# Patient Record
Sex: Male | Born: 1980 | Race: Black or African American | Hispanic: No | Marital: Married | Smoking: Never smoker
Health system: Southern US, Community
[De-identification: ages and names within clinical notes are randomized; demographics above are authoritative.]

## PROBLEM LIST (undated history)

## (undated) DIAGNOSIS — I10 Essential (primary) hypertension: Secondary | ICD-10-CM

## (undated) DIAGNOSIS — I509 Heart failure, unspecified: Secondary | ICD-10-CM

---

## 2020-10-26 ENCOUNTER — Other Ambulatory Visit: Payer: Self-pay

## 2020-10-26 ENCOUNTER — Encounter (HOSPITAL_COMMUNITY): Payer: Self-pay | Admitting: Emergency Medicine

## 2020-10-26 ENCOUNTER — Ambulatory Visit (HOSPITAL_COMMUNITY)
Admission: EM | Admit: 2020-10-26 | Discharge: 2020-10-26 | Disposition: A | Discharge status: Home / Self Care | Payer: Medicaid Other | Attending: Family Medicine | Admitting: Family Medicine

## 2020-10-26 DIAGNOSIS — Z20822 Contact with and (suspected) exposure to covid-19: Secondary | ICD-10-CM | POA: Diagnosis not present

## 2020-10-26 DIAGNOSIS — J069 Acute upper respiratory infection, unspecified: Secondary | ICD-10-CM | POA: Diagnosis present

## 2020-10-26 MED ORDER — PROMETHAZINE-DM 6.25-15 MG/5ML PO SYRP: 5.0000 mL | ORAL_SOLUTION | Freq: Four times a day (QID) | ORAL | 0 refills | Status: DC | PRN

## 2020-10-26 NOTE — ED Provider Notes (Signed)
[facility]    CSN: [id number] Arrival date & time: 10/26/20  1729      History   Chief Complaint Chief Complaint  Patient presents with  . URI    HPI Ruben Espinoza is a 40 y.o. male.   Here today with 1 day history of cough, fever, headaches, nausea, fatigue, body aches. Denies CP, SOB, vomiting, diarrhea. Taking ibuprofen without much relief. Denies known chronic medical problems. Work exposures to [employer].      History reviewed. No pertinent past medical history.  There are no problems to display for this patient.   History reviewed. No pertinent surgical history.     Home Medications    Prior to Admission medications   Medication Sig Start Date End Date Taking? Authorizing Provider  promethazine-dextromethorphan (PROMETHAZINE-DM) 6.25-15 MG/5ML syrup Take 5 mLs by mouth 4 (four) times daily as needed for cough. 10/26/20  Yes [name], PA-C    Family History History reviewed. No pertinent family history.  Social History Social History   Tobacco Use  . Smoking status: Never Smoker  . Smokeless tobacco: Never Used     Allergies   Shellfish allergy   Review of Systems Review of Systems PER HPI   Physical Exam Triage Vital Signs ED Triage Vitals  Enc Vitals Group     BP 10/26/20 1846 (!) 187/108     Pulse Rate 10/26/20 1846 83     Resp 10/26/20 1846 16     Temp 10/26/20 1846 99.1 F (37.3 C)     Temp Source 10/26/20 1846 Oral     SpO2 10/26/20 1846 94 %     Weight --      Height --      Head Circumference --      Peak Flow --      Pain Score 10/26/20 1848 3     Pain Loc --      Pain Edu? --      Excl. in GC? --    No data found.  Updated Vital Signs BP (!) 187/108 (BP Location: Left Arm)   Pulse 83   Temp 99.1 F (37.3 C) (Oral)   Resp 16   SpO2 94%   Visual Acuity Right Eye Distance:   Left Eye Distance:   Bilateral Distance:    Right Eye Near:   Left Eye Near:    Bilateral Near:     Physical  Exam Vitals and nursing note reviewed.  Constitutional:      Appearance: Normal appearance.  HENT:     Head: Atraumatic.     Right Ear: Tympanic membrane normal.     Left Ear: Tympanic membrane normal.     Nose: Rhinorrhea present.     Mouth/Throat:     Mouth: Mucous membranes are moist.     Pharynx: Posterior oropharyngeal erythema present.  Eyes:     Extraocular Movements: Extraocular movements intact.     Conjunctiva/sclera: Conjunctivae normal.  Cardiovascular:     Rate and Rhythm: Normal rate and regular rhythm.     Heart sounds: Normal heart sounds.  Pulmonary:     Effort: Pulmonary effort is normal. No respiratory distress.     Breath sounds: Normal breath sounds. No wheezing.  Abdominal:     General: Bowel sounds are normal. There is no distension.     Palpations: Abdomen is soft.     Tenderness: There is no abdominal tenderness. There is no guarding.  Musculoskeletal:  General: Normal range of motion.     Cervical back: Normal range of motion and neck supple.  Skin:    General: Skin is warm and dry.     Findings: No rash.  Neurological:     General: No focal deficit present.     Mental Status: He is oriented to person, place, and time.  Psychiatric:        Mood and Affect: Mood normal.        Thought Content: Thought content normal.        Judgment: Judgment normal.      UC Treatments / Results  Labs (all labs ordered are listed, but only abnormal results are displayed) Labs Reviewed  SARS CORONAVIRUS 2 (TAT 6-24 HRS)    EKG   Radiology No results found.  Procedures Procedures (including critical care time)  Medications Ordered in UC Medications - No data to display  Initial Impression / Assessment and Plan / UC Course  I have reviewed the triage vital signs and the nursing notes.  Pertinent labs & imaging results that were available during my care of the patient were reviewed by me and considered in my medical decision making (see chart  for details).     Exam and vitals reassuring today, COVID pcr pending. Phenergan DM, OTC medications and supportive care reviewed. Work note given, instructed to isolate, return for worsening sxs.   Final Clinical Impressions(s) / UC Diagnoses   Final diagnoses:  Viral URI with cough   Discharge Instructions   None    ED Prescriptions    Medication Sig Dispense Auth. Provider   promethazine-dextromethorphan (PROMETHAZINE-DM) 6.25-15 MG/5ML syrup Take 5 mLs by mouth 4 (four) times daily as needed for cough. 100 mL Particia Espinoza, [place]     PDMP not reviewed this encounter.   Particia Espinoza, [place] 10/26/20 1946

## 2020-10-26 NOTE — ED Triage Notes (Signed)
Pt c/o cold sx onset yest associated w/cough, fever, headaches, nausea  Denies vomiting, diarrhea  Taking OTC Ibuprofen   Reports his boss tested positive for COVID  A&O X4... NAD.Marland Kitchen. ambulatory

## 2020-10-27 LAB — SARS CORONAVIRUS 2 (TAT 6-24 HRS): SARS Coronavirus 2: NEGATIVE

## 2020-12-12 ENCOUNTER — Encounter (HOSPITAL_COMMUNITY): Payer: Self-pay | Admitting: Emergency Medicine

## 2020-12-12 ENCOUNTER — Emergency Department (HOSPITAL_COMMUNITY): Payer: Medicaid Other

## 2020-12-12 ENCOUNTER — Other Ambulatory Visit: Payer: Self-pay

## 2020-12-12 ENCOUNTER — Emergency Department (HOSPITAL_COMMUNITY)
Admission: EM | Admit: 2020-12-12 | Discharge: 2020-12-12 | Disposition: A | Discharge status: Home / Self Care | Payer: Medicaid Other | Attending: Emergency Medicine | Admitting: Emergency Medicine

## 2020-12-12 DIAGNOSIS — R55 Syncope and collapse: Secondary | ICD-10-CM | POA: Diagnosis not present

## 2020-12-12 DIAGNOSIS — R42 Dizziness and giddiness: Secondary | ICD-10-CM | POA: Diagnosis not present

## 2020-12-12 DIAGNOSIS — R030 Elevated blood-pressure reading, without diagnosis of hypertension: Secondary | ICD-10-CM | POA: Insufficient documentation

## 2020-12-12 DIAGNOSIS — I509 Heart failure, unspecified: Secondary | ICD-10-CM | POA: Diagnosis not present

## 2020-12-12 DIAGNOSIS — I10 Essential (primary) hypertension: Secondary | ICD-10-CM

## 2020-12-12 HISTORY — DX: Heart failure, unspecified: I50.9

## 2020-12-12 LAB — BASIC METABOLIC PANEL
Anion gap: 9 (ref 5–15)
BUN: 19 mg/dL (ref 6–20)
CO2: 27 mmol/L (ref 22–32)
Calcium: 9.1 mg/dL (ref 8.9–10.3)
Chloride: 102 mmol/L (ref 98–111)
Creatinine, Ser: 1.82 mg/dL — ABNORMAL HIGH (ref 0.61–1.24)
GFR, Estimated: 48 mL/min — ABNORMAL LOW (ref >60)
Glucose, Bld: 110 mg/dL — ABNORMAL HIGH (ref 70–99)
Potassium: 3.8 mmol/L (ref 3.5–5.1)
Sodium: 138 mmol/L (ref 135–145)

## 2020-12-12 LAB — TSH: TSH: 0.552 u[IU]/mL (ref 0.350–4.500)

## 2020-12-12 LAB — CBC
HCT: 51.3 % (ref 39.0–52.0)
Hemoglobin: 16.7 g/dL (ref 13.0–17.0)
MCH: 29.9 pg (ref 26.0–34.0)
MCHC: 32.6 g/dL (ref 30.0–36.0)
MCV: 91.9 fL (ref 80.0–100.0)
Platelets: 208 10*3/uL (ref 150–400)
RBC: 5.58 MIL/uL (ref 4.22–5.81)
RDW: 12.3 % (ref 11.5–15.5)
WBC: 10.9 10*3/uL — ABNORMAL HIGH (ref 4.0–10.5)
nRBC: 0 % (ref 0.0–0.2)

## 2020-12-12 LAB — URINALYSIS, ROUTINE W REFLEX MICROSCOPIC
Bilirubin Urine: NEGATIVE
Glucose, UA: 250 mg/dL — AB
Hgb urine dipstick: NEGATIVE
Ketones, ur: NEGATIVE mg/dL
Leukocytes,Ua: NEGATIVE
Nitrite: NEGATIVE
Protein, ur: NEGATIVE mg/dL
Specific Gravity, Urine: 1.025 (ref 1.005–1.030)
pH: 6 (ref 5.0–8.0)

## 2020-12-12 LAB — TROPONIN I (HIGH SENSITIVITY)
Troponin I (High Sensitivity): 22 ng/L — ABNORMAL HIGH (ref <18)
Troponin I (High Sensitivity): 19 ng/L — ABNORMAL HIGH (ref <18)

## 2020-12-12 LAB — CBG MONITORING, ED: Glucose-Capillary: 123 mg/dL — ABNORMAL HIGH (ref 70–99)

## 2020-12-12 MED ORDER — HYDRALAZINE HCL 25 MG PO TABS
100.0000 mg | ORAL_TABLET | Freq: Once | ORAL | Status: AC
  Administered 2020-12-12: 100 mg via ORAL
  Filled 2020-12-12: qty 4

## 2020-12-12 MED ORDER — CARVEDILOL 12.5 MG PO TABS
25.0000 mg | ORAL_TABLET | Freq: Once | ORAL | Status: AC
  Administered 2020-12-12: 25 mg via ORAL
  Filled 2020-12-12: qty 2

## 2020-12-12 MED ORDER — LORAZEPAM 2 MG/ML IJ SOLN: 1.0000 mg | Freq: Once | INTRAMUSCULAR | Status: DC

## 2020-12-12 MED ORDER — LABETALOL HCL 5 MG/ML IV SOLN
10.0000 mg | Freq: Once | INTRAVENOUS | Status: AC
  Administered 2020-12-12: 10 mg via INTRAVENOUS
  Filled 2020-12-12: qty 4

## 2020-12-12 NOTE — ED Notes (Signed)
Transported to CT 

## 2020-12-12 NOTE — Discharge Instructions (Signed)
1.  You must follow-up with [facility] for further evaluation.  Due to your persistent and poorly controlled hypertension, you have increased risk for a stroke.  There is also concern for possible seizure.  You will need further evaluation.  An ambulatory referral has been placed.  Call tomorrow to schedule your appointment. 2.  No driving or doing activities that could result in injury if you had a seizure or loss of consciousness until you have been seen by the neurologist and completed evaluation 3.  Take all of your blood pressure medications as prescribed.  Follow instructions for diet management and lifestyle management for hypertension.  Keep a journal of your blood pressures.  It is very important you work with your doctor to get control of your blood pressure. 4.  Return to the emergency department if you develop any concerning or worsening symptoms

## 2020-12-12 NOTE — ED Triage Notes (Signed)
Pt reports syncopal episode at work last night.  Denies injury from fall.  Denies dizziness.

## 2020-12-12 NOTE — ED Provider Notes (Signed)
Care of the patient assumed at the change of shift. Patient here for syncope, initially hypertensive but improving with his meds. He was awaiting MRI at shift change.  Physical Exam  BP (!) 156/94 (BP Location: Right Arm)   Pulse 72   Temp 98.3 F (36.8 C) (Oral)   Resp 16   SpO2 100%   Physical Exam Awake and alert No obvious focal deficits ED Course/Procedures     Procedures  MDM  MRI with chronic changes, advanced for age but no acute process. Suspect this is secondary to HTN. He reports he has refills for all of his medications and does not need any additional rx today. PCP follow up and Neurology referral.       Pollyann Savoy, MD 12/12/20 1723

## 2020-12-12 NOTE — ED Provider Notes (Signed)
[facility] EMERGENCY DEPARTMENT Provider Note   CSN: [id number] Arrival date & time: 12/12/20  5726     History Chief Complaint  Patient presents with  . Loss of Consciousness    Ruben Espinoza is a 40 y.o. male.  HPI Patient reports he had episode of passing out at work last night.  He works evening shift.  He went into work at about 6 PM.  He reports around 10pm he went up some stairs and then passed out quite abruptly at the top of the steps.  He denies he was feeling any chest pain or shortness of breath when this happened.  He reports he typically goes up and down the stairs without issue.  He denies really any symptoms leading up to it but does note that it seemed like maybe his hearing temporarily stopped just before it occurred.  He denies tunnel vision, double vision or loss of vision.  He reports when he came back around, he did feel a little disoriented.  He denies he had any focal weakness numbness or tingling.  He did not have a headache or double vision.  He reports he went to the nurses station and they checked his blood pressure.  Blood pressure was elevated but he reports that his blood pressure is chronically elevated despite being compliant with multiple antihypertensive medications.  He denies any drug use.  He reports he went home and stayed up most of the rest of the night.  He reports he stayed up because he just does not typically like to sleep at night.  He waited for his brother to get home to drive him to the emergency department for evaluation.  He reports he continued to have a symptom of an "odd pulsating sensation".  He denies he ever experienced any focal weakness numbness or tingling of any extremity.  He denies that he feels badly.  He reports he does feel slightly more dizzy at this time if he changes position or sits up or stands    Past Medical History:  Diagnosis Date  . CHF (congestive heart failure) (HCC)     There are no problems to  display for this patient.   History reviewed. No pertinent surgical history.     No family history on file.  Social History   Tobacco Use  . Smoking status: Never Smoker  . Smokeless tobacco: Never Used  Substance Use Topics  . Alcohol use: Yes  . Drug use: Not Currently    Home Medications Prior to Admission medications   Medication Sig Start Date End Date Taking? Authorizing Provider  amLODipine (NORVASC) 10 MG tablet Take 10 mg by mouth daily. 12/05/20  Yes [provider]  carvedilol (COREG) 25 MG tablet Take 25 mg by mouth 2 (two) times daily. 11/14/20  Yes [provider]  furosemide (LASIX) 40 MG tablet Take 80 mg by mouth daily. 11/17/20  Yes [provider]  hydrALAZINE (APRESOLINE) 100 MG tablet Take 100 mg by mouth 3 (three) times daily. 11/19/20  Yes [provider]  losartan (COZAAR) 100 MG tablet Take 100 mg by mouth daily. 11/14/20  Yes [provider]  spironolactone (ALDACTONE) 25 MG tablet Take 25 mg by mouth daily. 11/17/20  Yes [provider]  promethazine-dextromethorphan (PROMETHAZINE-DM) 6.25-15 MG/5ML syrup Take 5 mLs by mouth 4 (four) times daily as needed for cough. Patient not taking: Reported on 12/12/2020 10/26/20   [name], PA-C    Allergies  Shellfish allergy and Isosorbide dinitrate  Review of Systems   Review of Systems 10 systems reviewed and negative except as per HPI Physical Exam Updated Vital Signs BP (!) 162/115 (BP Location: Left Arm)   Pulse 82   Temp 98.3 F (36.8 C) (Oral)   Resp 20   SpO2 97%   Physical Exam Constitutional:      Appearance: He is well-developed and well-nourished.  HENT:     Head: Normocephalic and atraumatic.     Mouth/Throat:     Mouth: Mucous membranes are moist.     Pharynx: Oropharynx is clear.  Eyes:     Extraocular Movements: Extraocular movements intact and EOM normal.     Conjunctiva/sclera: Conjunctivae normal.     Pupils:  Pupils are equal, round, and reactive to light.  Cardiovascular:     Rate and Rhythm: Normal rate and regular rhythm.     Pulses: Intact distal pulses.     Heart sounds: Normal heart sounds.  Pulmonary:     Effort: Pulmonary effort is normal.     Breath sounds: Normal breath sounds.  Abdominal:     General: Bowel sounds are normal. There is no distension.     Palpations: Abdomen is soft.     Tenderness: There is no abdominal tenderness.  Musculoskeletal:        General: No tenderness or edema. Normal range of motion.     Cervical back: Neck supple.     Right lower leg: No edema.     Left lower leg: No edema.  Skin:    General: Skin is warm, dry and intact.  Neurological:     Mental Status: He is alert and oriented to person, place, and time.     GCS: GCS eye subscore is 4. GCS verbal subscore is 5. GCS motor subscore is 6.     Cranial Nerves: No cranial nerve deficit.     Sensory: No sensory deficit.     Motor: No weakness.     Coordination: Coordination normal.     Deep Tendon Reflexes: Strength normal.     Comments: Normal finger-nose exam bilaterally.  Motor strength 5\5 upper lower extremities.  No sensory deficit.  Psychiatric:        Mood and Affect: Mood and affect and mood normal.     ED Results / Procedures / Treatments   Labs (all labs ordered are listed, but only abnormal results are displayed) Labs Reviewed  BASIC METABOLIC PANEL - Abnormal; Notable for the following components:      Result Value   Glucose, Bld 110 (*)    Creatinine, Ser 1.82 (*)    GFR, Estimated 48 (*)    All other components within normal limits  CBC - Abnormal; Notable for the following components:   WBC 10.9 (*)    All other components within normal limits  URINALYSIS, ROUTINE W REFLEX MICROSCOPIC - Abnormal; Notable for the following components:   Glucose, UA 250 (*)    All other components within normal limits  CBG MONITORING, ED - Abnormal; Notable for the following components:    Glucose-Capillary 123 (*)    All other components within normal limits  TROPONIN I (HIGH SENSITIVITY) - Abnormal; Notable for the following components:   Troponin I (High Sensitivity) 22 (*)    All other components within normal limits  TSH  RAPID URINE DRUG SCREEN, HOSP PERFORMED  TROPONIN I (HIGH SENSITIVITY)    EKG EKG Interpretation  Date/Time:  Tuesday December 12 2020 09:40:33 EST Ventricular Rate:  78 PR Interval:  170 QRS Duration: 104 QT Interval:  418 QTC Calculation: 476 R Axis:   -23 Text Interpretation: Normal sinus rhythm Moderate voltage criteria for LVH, may be normal variant ( R in aVL , Cornell product ) T wave abnormality, consider lateral ischemia Prolonged QT Abnormal ECG no STEMI. no old comparison Confirmed by Arby Barrette [phone]) on 12/12/2020 12:39:08 PM   Radiology CT Head Wo Contrast  Result Date: 12/12/2020 CLINICAL DATA:  Mental status change, unknown cause. Additional history provided: Patient reportedly "passed out" last night. EXAM: CT HEAD WITHOUT CONTRAST TECHNIQUE: Contiguous axial images were obtained from the base of the skull through the vertex without intravenous contrast. COMPARISON:  No pertinent prior exams available for comparison. FINDINGS: Brain: Nonspecific cerebral Soltis matter disease is overall mild, but advanced for age. The right parietal lobe is most notably affected. There is no acute intracranial hemorrhage. No demarcated cortical infarct. No extra-axial fluid collection. No evidence of intracranial mass. No midline shift. Vascular: No hyperdense vessel. Skull: Normal. Negative for fracture or focal lesion. Sinuses/Orbits: Visualized orbits show no acute finding. Mild bilateral ethmoid, sphenoid and maxillary sinus mucosal thickening at the imaged levels. Other: Right parietal scalp soft tissue swelling/hematoma. IMPRESSION: No evidence of acute intracranial abnormality. Parietal scalp soft tissue swelling/hematoma. 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 The right parietal lobe is most notably affected. Mild paranasal sinus mucosal thickening. Electronically Signed   By: Jackey Loge DO   On: 12/12/2020 13:48    Procedures Procedures   Medications Ordered in ED Medications  LORazepam (ATIVAN) injection 1 mg (has no administration in time range)  labetalol (NORMODYNE) injection 10 mg (has no administration in time range)  carvedilol (COREG) tablet 25 mg (25 mg Oral Given 12/12/20 1405)  hydrALAZINE (APRESOLINE) tablet 100 mg (100 mg Oral Given 12/12/20 1403)    ED Course  I have reviewed the triage vital signs and the nursing notes.  Pertinent labs & imaging results that were available during my care of the patient were reviewed by me and considered in my medical decision making (see chart for details).    MDM Rules/Calculators/A&P                         Consult: Reviewed with Dr. Jerrell Belfast  Patient does have longstanding history of hypertension despite young age.  He reports poor control of hypertension despite compliance with medications.  Patient had a syncopal episode without injury.  He denied prodromal symptoms.  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  History and findings reviewed with neurology.  At this time we will proceed with MRI to rule out any acute stroke given patient's risk factors.  Neurologic exam is normal at this time.  Patient did not endorse any chest pain or palpitation.  Troponins are flat and EKG does not show acute ischemic changes.  If work-up does not reveal acute cause for syncope.  Per consultation with neurology, plan will be for expeditious outpatient follow-up with neurology and further evaluation for possible seizure with seizure precautions given.  Patient will also need close follow-up with his PCP to optimize blood pressure management.  On review of systems, patient does not suggest ongoing symptoms of endorgan damage with  hypertension.  He is not experiencing exertional chest pain, routine headaches or visual problems.  Dr. Bernette Mayers will follow up on MRI for final disposition with anticipated  discharge. Final Clinical Impression(s) / ED Diagnoses Final diagnoses:  Syncope and collapse    Rx / DC Orders ED Discharge Orders    None       Arby BarrettePfeiffer, Elivia Robotham, MD 12/12/20 [phone]

## 2021-04-18 ENCOUNTER — Other Ambulatory Visit: Payer: Self-pay

## 2021-04-18 ENCOUNTER — Encounter (HOSPITAL_COMMUNITY): Payer: Self-pay

## 2021-04-18 ENCOUNTER — Ambulatory Visit (HOSPITAL_COMMUNITY)
Admission: EM | Admit: 2021-04-18 | Discharge: 2021-04-18 | Disposition: A | Discharge status: Home / Self Care | Payer: Medicaid Other | Attending: Family Medicine | Admitting: Family Medicine

## 2021-04-18 DIAGNOSIS — Z0189 Encounter for other specified special examinations: Secondary | ICD-10-CM

## 2021-04-18 DIAGNOSIS — Z20822 Contact with and (suspected) exposure to covid-19: Secondary | ICD-10-CM | POA: Insufficient documentation

## 2021-04-18 LAB — SARS CORONAVIRUS 2 (TAT 6-24 HRS): SARS Coronavirus 2: NEGATIVE

## 2021-04-18 NOTE — ED Triage Notes (Signed)
Pt presents for COVID testing after exposure. Pt denies fever, shortness of breath, cough, or any other symptoms.

## 2021-04-26 ENCOUNTER — Other Ambulatory Visit: Payer: Self-pay

## 2021-04-26 ENCOUNTER — Ambulatory Visit (HOSPITAL_COMMUNITY)
Admission: EM | Admit: 2021-04-26 | Discharge: 2021-04-26 | Disposition: A | Discharge status: Home / Self Care | Payer: Medicaid Other | Attending: Emergency Medicine | Admitting: Emergency Medicine

## 2021-04-26 ENCOUNTER — Encounter (HOSPITAL_COMMUNITY): Payer: Self-pay

## 2021-04-26 DIAGNOSIS — R519 Headache, unspecified: Secondary | ICD-10-CM | POA: Insufficient documentation

## 2021-04-26 DIAGNOSIS — J029 Acute pharyngitis, unspecified: Secondary | ICD-10-CM | POA: Diagnosis not present

## 2021-04-26 DIAGNOSIS — Z79899 Other long term (current) drug therapy: Secondary | ICD-10-CM | POA: Insufficient documentation

## 2021-04-26 DIAGNOSIS — B349 Viral infection, unspecified: Secondary | ICD-10-CM | POA: Diagnosis not present

## 2021-04-26 DIAGNOSIS — Z20822 Contact with and (suspected) exposure to covid-19: Secondary | ICD-10-CM | POA: Diagnosis not present

## 2021-04-26 LAB — SARS CORONAVIRUS 2 (TAT 6-24 HRS): SARS Coronavirus 2: NEGATIVE

## 2021-04-26 NOTE — ED Triage Notes (Signed)
Pt states he feels tired, has headache and feels nauseas.   States he was exposed to his brother in law who has COVID.

## 2021-04-26 NOTE — ED Provider Notes (Signed)
[facility]    CSN: [id number] Arrival date & time: 04/26/21  2836      History   Chief Complaint Chief Complaint  Patient presents with   Fatigue   Headache    HPI Ruben Espinoza is a 40 y.o. male.   Patient presents with intermittent generalized headache, fatigue, intermittent nausea, body aches, chills, nasal congestion and mild sore throat for 5 days.  Denies shortness of breath, cough, fever, ear pain or fullness. Can tolerate food and liquids but has been fasting for the last three days. Was feeling unwell at the beginning of last week, got COVID test on 6/30 at [facility], negative. Started to feel unwell again on 7/2, got exposed to covid on 7/4, feels like symptoms worsened after exposure. Not taking medications, feels it causes GI upset.   Past Medical History:  Diagnosis Date   CHF (congestive heart failure) (HCC)     There are no problems to display for this patient.   History reviewed. No pertinent surgical history.     Home Medications    Prior to Admission medications   Medication Sig Start Date End Date Taking? Authorizing Provider  amLODipine (NORVASC) 10 MG tablet Take 10 mg by mouth daily. 12/05/20   [provider]  carvedilol (COREG) 25 MG tablet Take 25 mg by mouth 2 (two) times daily. 11/14/20   [provider]  furosemide (LASIX) 40 MG tablet Take 80 mg by mouth daily. 11/17/20   [provider]  hydrALAZINE (APRESOLINE) 100 MG tablet Take 100 mg by mouth 3 (three) times daily. 11/19/20   [provider]  losartan (COZAAR) 100 MG tablet Take 100 mg by mouth daily. 11/14/20   [provider]  promethazine-dextromethorphan (PROMETHAZINE-DM) 6.25-15 MG/5ML syrup Take 5 mLs by mouth 4 (four) times daily as needed for cough. Patient not taking: No sig reported 10/26/20   [name], PA-C  spironolactone (ALDACTONE) 25 MG tablet Take 25 mg by mouth daily. 11/17/20   [provider]    Family  History Family History  Problem Relation Age of Onset   Healthy Mother     Social History Social History   Tobacco Use   Smoking status: Never   Smokeless tobacco: Never  Vaping Use   Vaping Use: Never used  Substance Use Topics   Alcohol use: Yes   Drug use: Not Currently     Allergies   Shellfish allergy and Isosorbide dinitrate   Review of Systems Review of Systems Defer to HPI      Physical Exam Triage Vital Signs ED Triage Vitals  Enc Vitals Group     BP 04/26/21 1157 (!) 173/106     Pulse Rate 04/26/21 1157 61     Resp 04/26/21 1157 17     Temp 04/26/21 1157 99.2 F (37.3 C)     Temp Source 04/26/21 1157 Oral     SpO2 04/26/21 1157 100 %     Weight --      Height --      Head Circumference --      Peak Flow --      Pain Score 04/26/21 1156 0     Pain Loc --      Pain Edu? --      Excl. in GC? --    No data found.  Updated Vital Signs BP (!) 173/106 (BP Location: Left Arm)   Pulse 61   Temp 99.2 F (37.3 C) (Oral)   Resp  17   SpO2 100%   Visual Acuity Right Eye Distance:   Left Eye Distance:   Bilateral Distance:    Right Eye Near:   Left Eye Near:    Bilateral Near:     Physical Exam Constitutional:      Appearance: He is well-developed. He is obese.  HENT:     Head: Normocephalic.     Right Ear: Hearing, ear canal and external ear normal. A middle ear effusion is present.     Left Ear: Hearing, ear canal and external ear normal. A middle ear effusion is present.     Nose: Congestion present. No rhinorrhea.     Mouth/Throat:     Mouth: Mucous membranes are moist.     Pharynx: Oropharynx is clear.  Eyes:     Extraocular Movements: Extraocular movements intact.     Pupils: Pupils are equal, round, and reactive to light.  Cardiovascular:     Rate and Rhythm: Normal rate and regular rhythm.     Pulses: Normal pulses.     Heart sounds: Normal heart sounds.  Pulmonary:     Effort: Pulmonary effort is normal.     Breath sounds:  Normal breath sounds.  Musculoskeletal:     Cervical back: Normal range of motion and neck supple.  Neurological:     Mental Status: He is alert and oriented to person, place, and time.  Psychiatric:        Mood and Affect: Mood normal.        Behavior: Behavior normal.     UC Treatments / Results  Labs (all labs ordered are listed, but only abnormal results are displayed) Labs Reviewed  SARS CORONAVIRUS 2 (TAT 6-24 HRS)    EKG   Radiology No results found.  Procedures Procedures (including critical care time)  Medications Ordered in UC Medications - No data to display  Initial Impression / Assessment and Plan / UC Course  I have reviewed the triage vital signs and the nursing notes.  Pertinent labs & imaging results that were available during my care of the patient were reviewed by me and considered in my medical decision making (see chart for details).  Viral Illness  Covid test pending, declined flu test Declining use of medications for symptom management  Final Clinical Impressions(s) / UC Diagnoses   Final diagnoses:  Viral illness     Discharge Instructions      Covid test pending 24 hours, you will be called if positive  Can use any over the counter medication that provides you relief to help with symptoms    ED Prescriptions   None    PDMP not reviewed this encounter.   [place], [place], [place] 04/26/21 [phone]

## 2021-04-26 NOTE — Discharge Instructions (Signed)
Covid test pending 24 hours, you will be called if positive  Can use any over the counter medication that provides you relief to help with symptoms

## 2021-06-05 IMAGING — CT CT HEAD W/O CM
3 series · 15 of 47 positions shown, 18 images · non-contrast
Comparison: No pertinent prior exams available for comparison.

CLINICAL DATA: Mental status change, unknown cause. Additional
history provided: Patient reportedly "passed out" last night.

EXAM:
CT HEAD WITHOUT CONTRAST
TECHNIQUE: Contiguous axial images were obtained from the base of the skull
through the vertex without intravenous contrast.

[Series 3: head 5.0 h30s · axial · 0.47mm/px · z∈[-117,+18]mm · 9 of 33 slices shown, 12 images]
[im 3/33  brain]
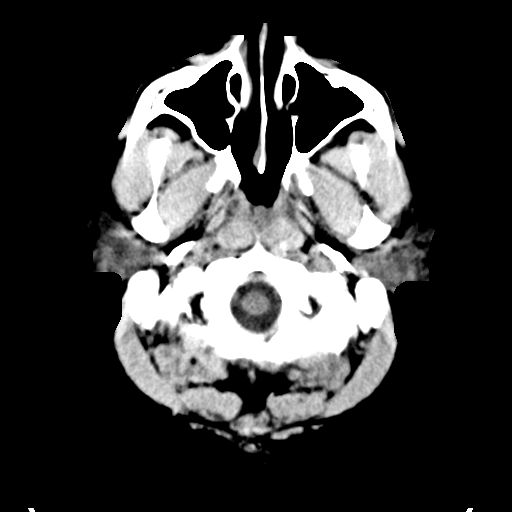
[im 3/33  bone]
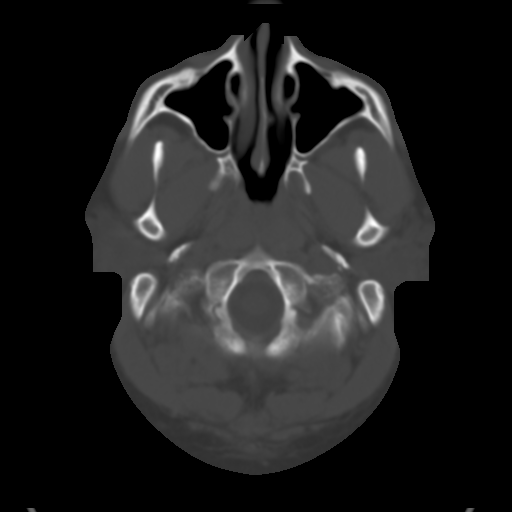
[im 6/33  brain]
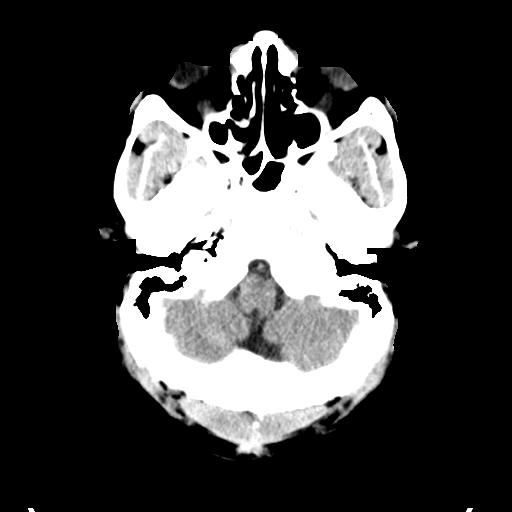
[im 9/33  brain]
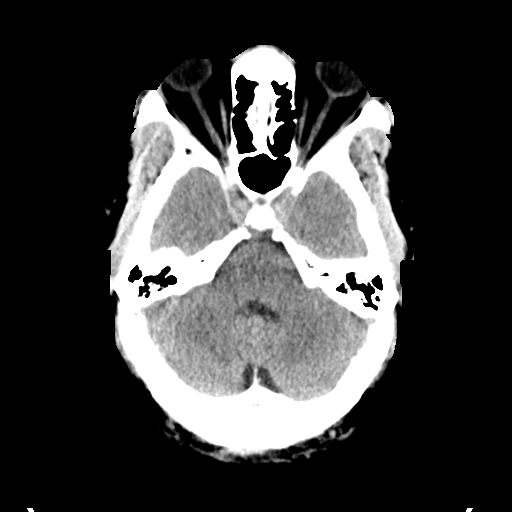
[im 13/33  brain]
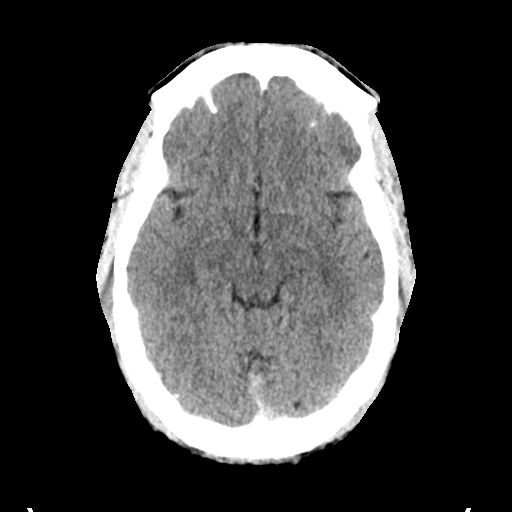
[im 17/33  brain]
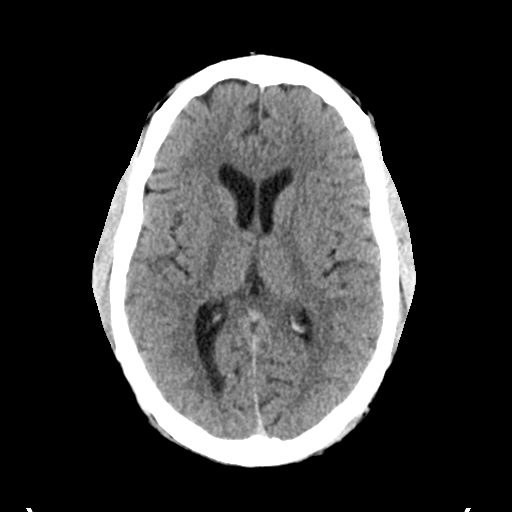
[im 17/33  bone]
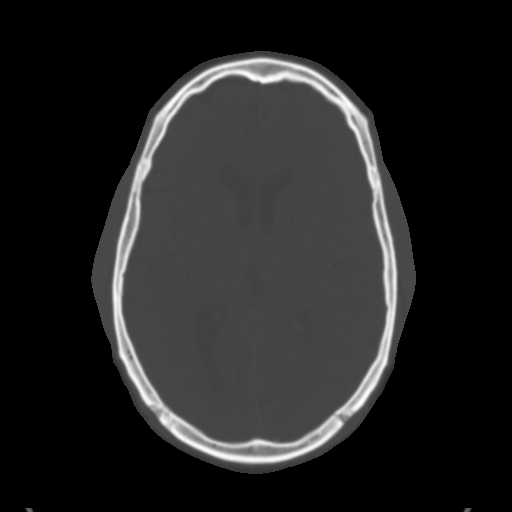
[im 20/33  brain]
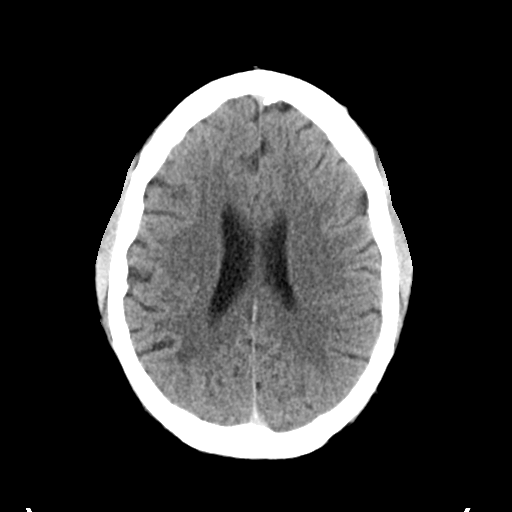
[im 24/33  brain]
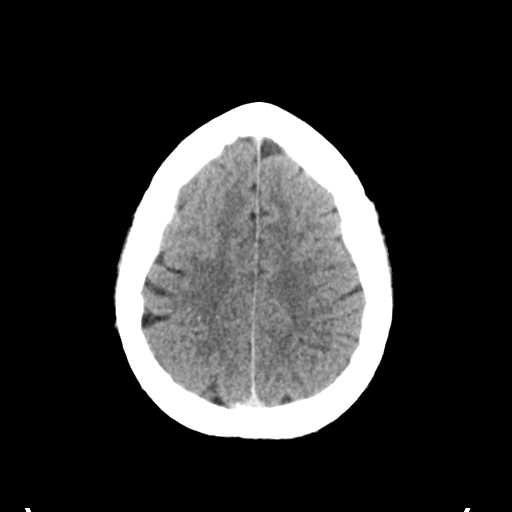
[im 27/33  brain]
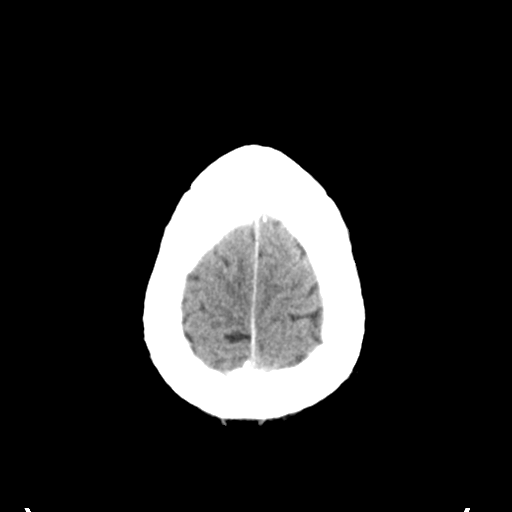
[im 30/33  brain]
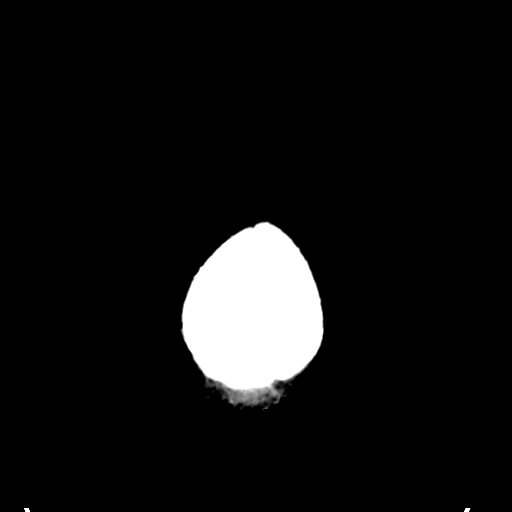
[im 30/33  bone]
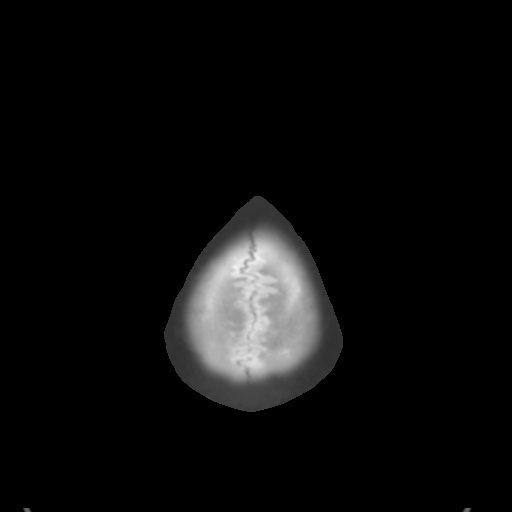

[Series 5: head 3.0 mpr cor · coronal · 0.32mm/px · 3 of 75 slices shown]
[im 25/75  brain]
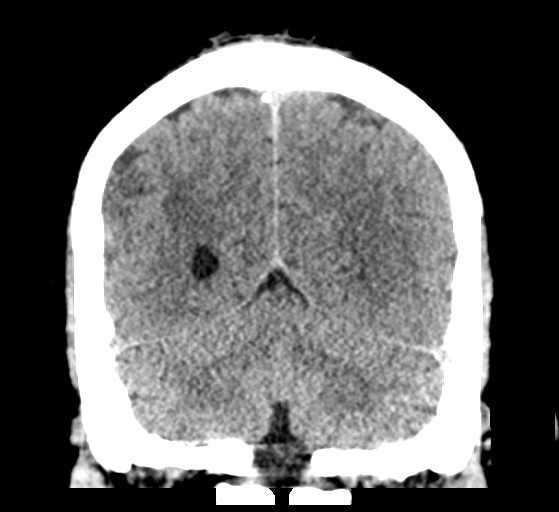
[im 33/75  brain]
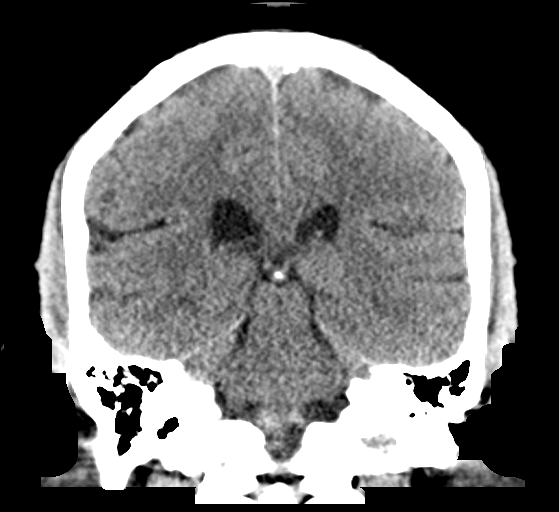
[im 42/75  brain]
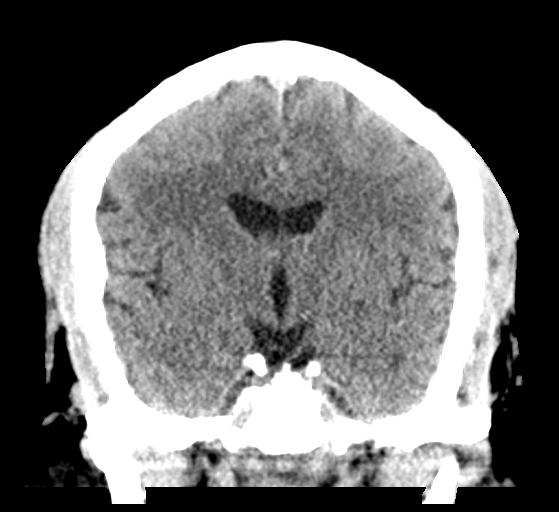

[Series 6: head 3.0 mpr sag · sagittal · 0.32mm/px · 3 of 58 slices shown]
[im 20/58  brain]
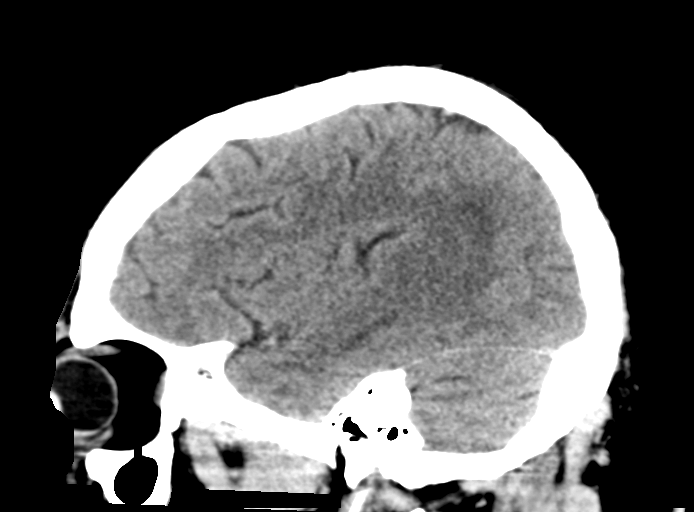
[im 29/58  brain]
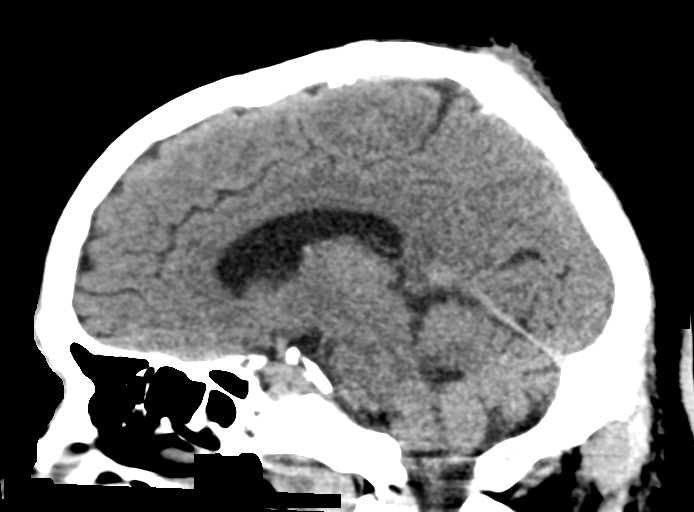
[im 39/58  brain]
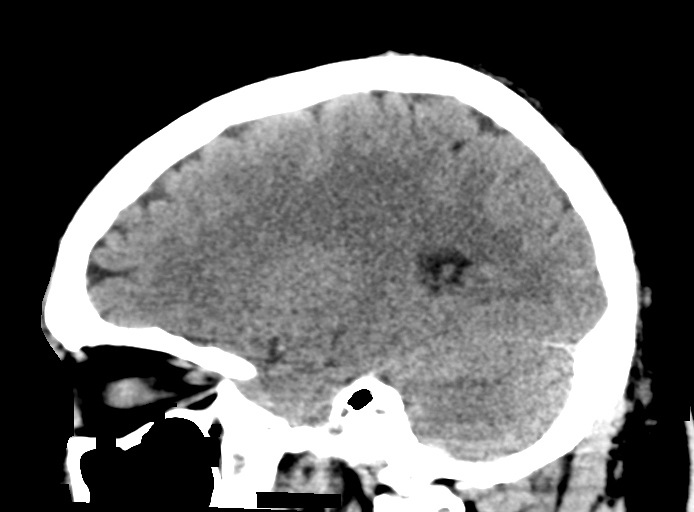

[15 of 47 positions shown; findings below may reference images not displayed]

FINDINGS: Brain:

Nonspecific cerebral white matter disease is overall mild, but
advanced for age. The right parietal lobe is most notably affected.

There is no acute intracranial hemorrhage.

No demarcated cortical infarct.

No extra-axial fluid collection.

No evidence of intracranial mass.

No midline shift.

Vascular: No hyperdense vessel.

Skull: Normal. Negative for fracture or focal lesion.

Sinuses/Orbits: Visualized orbits show no acute finding. Mild
bilateral ethmoid, sphenoid and maxillary sinus mucosal thickening
at the imaged levels.

Other: Right parietal scalp soft tissue swelling/hematoma.
IMPRESSION: No evidence of acute intracranial abnormality.

Parietal scalp soft tissue swelling/hematoma.

Nonspecific cerebral white matter disease which is overall mild, but
advanced for age. The right parietal lobe is most notably affected.

Mild paranasal sinus mucosal thickening.

## 2023-10-08 ENCOUNTER — Encounter (HOSPITAL_COMMUNITY): Payer: Self-pay | Admitting: Emergency Medicine

## 2023-10-08 ENCOUNTER — Other Ambulatory Visit: Payer: Self-pay

## 2023-10-08 ENCOUNTER — Emergency Department (HOSPITAL_COMMUNITY)
Admission: EM | Admit: 2023-10-08 | Discharge: 2023-10-08 | Disposition: A | Discharge status: Home / Self Care | Payer: Self-pay | Attending: Emergency Medicine | Admitting: Emergency Medicine

## 2023-10-08 DIAGNOSIS — I509 Heart failure, unspecified: Secondary | ICD-10-CM | POA: Insufficient documentation

## 2023-10-08 DIAGNOSIS — Z79899 Other long term (current) drug therapy: Secondary | ICD-10-CM | POA: Insufficient documentation

## 2023-10-08 DIAGNOSIS — H1031 Unspecified acute conjunctivitis, right eye: Secondary | ICD-10-CM | POA: Insufficient documentation

## 2023-10-08 DIAGNOSIS — I110 Hypertensive heart disease with heart failure: Secondary | ICD-10-CM | POA: Insufficient documentation

## 2023-10-08 DIAGNOSIS — H5789 Other specified disorders of eye and adnexa: Secondary | ICD-10-CM

## 2023-10-08 HISTORY — DX: Essential (primary) hypertension: I10

## 2023-10-08 MED ORDER — ERYTHROMYCIN 5 MG/GM OP OINT: TOPICAL_OINTMENT | OPHTHALMIC | 0 refills | Status: DC

## 2023-10-08 NOTE — ED Triage Notes (Addendum)
C/o R eye swelling and yellow drainage since Monday morning. Denies pain.  Denies vision changes.  Pt hypertensive.  States he took BP medication this morning and that it is usually still elevated.

## 2023-10-08 NOTE — Discharge Instructions (Signed)
Your history, exam, and evaluation today are consistent with bacterial conjunctivitis of the right eye irritating some of the lower eyelid as well.  Your exam was otherwise reassuring and we agreed to hold on more extensive lab or imaging workup.  You had some improvement your blood pressure here so please come and take her blood pressure medicine.  Please rest and stay hydrated and use the antibiotic ointment for your eye.  Symptoms should improve over the next few days.  If any symptoms change or worsen acutely, return to the nearest emergency department.

## 2023-10-08 NOTE — ED Provider Notes (Signed)
[place] EMERGENCY DEPARTMENT AT [facility] Provider Note   CSN: [id number] Arrival date & time: 10/08/23  1028     History  Chief Complaint  Patient presents with   Eye Drainage    Breken Zilliox is a 42 y.o. male.  The history is provided by the patient and medical records. No language interpreter was used.  Eye Problem Location:  Right eye Quality:  Dull Severity:  Mild Onset quality:  Gradual Duration:  3 days Timing:  Constant Progression:  Partially resolved Chronicity:  New Context: not burn, not chemical exposure, not direct trauma, not foreign body and not scratch   Relieved by:  Nothing Worsened by:  Nothing Ineffective treatments:  None tried Associated symptoms: crusting, discharge, itching, redness and swelling   Associated symptoms: no blurred vision, no decreased vision, no double vision, no headaches, no nausea, no photophobia, no vomiting and no weakness   Risk factors: no previous injury to eye        Home Medications Prior to Admission medications   Medication Sig Start Date End Date Taking? Authorizing Provider  amLODipine (NORVASC) 10 MG tablet Take 10 mg by mouth daily. 12/05/20   [provider]  carvedilol (COREG) 25 MG tablet Take 25 mg by mouth 2 (two) times daily. 11/14/20   [provider]  furosemide (LASIX) 40 MG tablet Take 80 mg by mouth daily. 11/17/20   [provider]  hydrALAZINE (APRESOLINE) 100 MG tablet Take 100 mg by mouth 3 (three) times daily. 11/19/20   [provider]  losartan (COZAAR) 100 MG tablet Take 100 mg by mouth daily. 11/14/20   [provider]  promethazine-dextromethorphan (PROMETHAZINE-DM) 6.25-15 MG/5ML syrup Take 5 mLs by mouth 4 (four) times daily as needed for cough. Patient not taking: No sig reported 10/26/20   [name], PA-C  spironolactone (ALDACTONE) 25 MG tablet Take 25 mg by mouth daily. 11/17/20   [provider]      Allergies     Shellfish allergy and Isosorbide dinitrate    Review of Systems   Review of Systems  Constitutional:  Negative for chills, fatigue and fever.  HENT:  Negative for congestion and facial swelling.   Eyes:  Positive for discharge, redness and itching. Negative for blurred vision, double vision, photophobia, pain and visual disturbance.  Respiratory:  Negative for chest tightness and shortness of breath.   Cardiovascular:  Negative for chest pain.  Gastrointestinal:  Negative for abdominal pain, nausea and vomiting.  Neurological:  Negative for weakness, light-headedness and headaches.  All other systems reviewed and are negative.   Physical Exam Updated Vital Signs BP (!) 162/113 (BP Location: Right Arm)   Pulse (!) 46   Temp 98.3 F (36.8 C)   Resp 18   SpO2 95%  Physical Exam Vitals and nursing note reviewed.  Constitutional:      General: He is not in acute distress.    Appearance: He is well-developed. He is not ill-appearing, toxic-appearing or diaphoretic.  HENT:     Head: Atraumatic.     Comments: Patient had mildly injected conjunctiva on the right with some swelling in the lower lid and some crusting/discharge.    Nose: No congestion or rhinorrhea.     Mouth/Throat:     Mouth: Mucous membranes are dry.     Pharynx: No oropharyngeal exudate or posterior oropharyngeal erythema.  Eyes:     General: No scleral icterus.       Right eye: Discharge  present.        Left eye: No discharge.     Extraocular Movements: Extraocular movements intact.     Right eye: Normal extraocular motion and no nystagmus.     Left eye: Normal extraocular motion and no nystagmus.     Conjunctiva/sclera:     Right eye: Right conjunctiva is injected. No chemosis, exudate or hemorrhage.    Pupils: Pupils are equal, round, and reactive to light.     Right eye: Pupil is round.     Slit lamp exam:    Right eye: No hyphema.     Comments: Given lack of vision changes or significant pain we will  hold on further evaluation plus or assessment conversation  Cardiovascular:     Rate and Rhythm: Normal rate and regular rhythm.     Heart sounds: No murmur heard. Pulmonary:     Effort: Pulmonary effort is normal. No respiratory distress.     Breath sounds: Normal breath sounds.  Abdominal:     Palpations: Abdomen is soft.     Tenderness: There is no abdominal tenderness.  Musculoskeletal:        General: No swelling.     Cervical back: Neck supple.  Skin:    General: Skin is warm and dry.     Capillary Refill: Capillary refill takes less than 2 seconds.  Neurological:     Mental Status: He is alert.  Psychiatric:        Mood and Affect: Mood normal.     ED Results / Procedures / Treatments   Labs (all labs ordered are listed, but only abnormal results are displayed) Labs Reviewed - No data to display  EKG None  Radiology No results found.  Procedures Procedures    Medications Ordered in ED Medications - No data to display  ED Course/ Medical Decision Making/ A&P                                 Medical Decision Making   Kaleeb Weaver is a 42 y.o. male with a past medical history significant for CHF and hypertension who presents with right eye discomfort, drainage, discharge, and redness.  According to patient, for the last 3 days he has had the symptoms although they do seem to slightly be improving.  He reports it was bothering him but he did not scratch drink anything and.  He reports that it was crusting and had discharge and there was redness on the eye and the lower eyelid.  Denies history of pinkeye or other eye infections.  Denies vision changes.  Denies any fevers, chills, congestion, cough.  Denies other complaints.  Denies trauma.  Denies chemical exposures.  On exam, lungs clear.  Chest nontender.  Neck nontender.  Patient is no tenderness about the eye and had normal extraocular moods.  Pupil symmetric and reactive.  Some injected conjunctiva and some mild  discharge on the lower eyelid.  Some swelling there.  No clear stye or hypopyon.  Exam otherwise unremarkable.  We had a shared decision-making conversation and agree with giving prescription for Romycin to treat the suspected conjunctivitis.  Patient will take this and follow-up with her primary doctor.  Given his lack of other complaints we agreed to hold on fluorescein eye exam staining or taking a pressure.  Patient understands return precautions and follow instructions and was discharged in good condition.  Final Clinical Impression(s) / ED Diagnoses Final diagnoses:  Acute conjunctivitis of right eye, unspecified acute conjunctivitis type  Discharge of right eye    Rx / DC Orders ED Discharge Orders          Ordered    erythromycin ophthalmic ointment        10/08/23 1807            Clinical Impression: 1. Acute conjunctivitis of right eye, unspecified acute conjunctivitis type   2. Discharge of right eye     Disposition: Discharge  Condition: Good  I have discussed the results, Dx and Tx plan with the pt(& family if present). He/she/they expressed understanding and agree(s) with the plan. Discharge instructions discussed at great length. Strict return precautions discussed and pt &/or family have verbalized understanding of the instructions. No further questions at time of discharge.    New Prescriptions   ERYTHROMYCIN OPHTHALMIC OINTMENT    Place a 1/2 inch ribbon of ointment into the Right Eye lower eyelid up to 6 times a day.    Follow Up: [facility] [address] [place] [address] [place] [address] [phone] Schedule an appointment as soon as possible for a visit    [facility] Emergency Department at [facility] [address] [place] [address] [phone]        Jalin Alicea, Canary Brim, MD 10/08/23 1815

## 2024-09-17 ENCOUNTER — Emergency Department (HOSPITAL_COMMUNITY)
Admission: EM | Admit: 2024-09-17 | Discharge: 2024-09-20 | Disposition: E | Payer: Self-pay | Attending: Emergency Medicine | Admitting: Emergency Medicine

## 2024-09-17 DIAGNOSIS — I130 Hypertensive heart and chronic kidney disease with heart failure and stage 1 through stage 4 chronic kidney disease, or unspecified chronic kidney disease: Secondary | ICD-10-CM | POA: Diagnosis not present

## 2024-09-17 DIAGNOSIS — Z79899 Other long term (current) drug therapy: Secondary | ICD-10-CM | POA: Diagnosis not present

## 2024-09-17 DIAGNOSIS — E876 Hypokalemia: Secondary | ICD-10-CM | POA: Insufficient documentation

## 2024-09-17 DIAGNOSIS — I509 Heart failure, unspecified: Secondary | ICD-10-CM | POA: Diagnosis not present

## 2024-09-17 DIAGNOSIS — I469 Cardiac arrest, cause unspecified: Secondary | ICD-10-CM | POA: Insufficient documentation

## 2024-09-17 DIAGNOSIS — N189 Chronic kidney disease, unspecified: Secondary | ICD-10-CM | POA: Insufficient documentation

## 2024-09-17 LAB — I-STAT VENOUS BLOOD GAS, ED
Acid-base deficit: 9 mmol/L — ABNORMAL HIGH (ref 0.0–2.0)
Bicarbonate: 23.6 mmol/L (ref 20.0–28.0)
Calcium, Ion: 1 mmol/L — ABNORMAL LOW (ref 1.15–1.40)
HCT: 49 % (ref 39.0–52.0)
Hemoglobin: 16.7 g/dL (ref 13.0–17.0)
O2 Saturation: 77 %
Potassium: 3 mmol/L — ABNORMAL LOW (ref 3.5–5.1)
Sodium: 142 mmol/L (ref 135–145)
TCO2: 26 mmol/L (ref 22–32)
pCO2, Ven: 80.3 mmHg (ref 44–60)
pH, Ven: 7.077 — CL (ref 7.25–7.43)
pO2, Ven: 59 mmHg — ABNORMAL HIGH (ref 32–45)

## 2024-09-17 LAB — I-STAT CHEM 8, ED
BUN: 21 mg/dL — ABNORMAL HIGH (ref 6–20)
Calcium, Ion: 1.02 mmol/L — ABNORMAL LOW (ref 1.15–1.40)
Chloride: 102 mmol/L (ref 98–111)
Creatinine, Ser: 2.1 mg/dL — ABNORMAL HIGH (ref 0.61–1.24)
Glucose, Bld: 246 mg/dL — ABNORMAL HIGH (ref 70–99)
HCT: 49 % (ref 39.0–52.0)
Hemoglobin: 16.7 g/dL (ref 13.0–17.0)
Potassium: 2.2 mmol/L — CL (ref 3.5–5.1)
Sodium: 145 mmol/L (ref 135–145)
TCO2: 25 mmol/L (ref 22–32)

## 2024-09-17 LAB — I-STAT CG4 LACTIC ACID, ED: Lactic Acid, Venous: 9.3 mmol/L (ref 0.5–1.9)

## 2024-09-17 LAB — CBG MONITORING, ED: Glucose-Capillary: 241 mg/dL — ABNORMAL HIGH (ref 70–99)

## 2024-09-17 MED ORDER — EPINEPHRINE 1 MG/10ML IV SOSY
PREFILLED_SYRINGE | INTRAVENOUS | Status: AC | PRN
  Administered 2024-09-17 (×5): 1 mg via INTRAVENOUS

## 2024-09-17 MED ORDER — SODIUM BICARBONATE 8.4 % IV SOLN
INTRAVENOUS | Status: AC | PRN
  Administered 2024-09-17: 50 meq via INTRAVENOUS

## 2024-09-17 MED ORDER — EPINEPHRINE 1 MG/10ML IV SOSY
PREFILLED_SYRINGE | INTRAVENOUS | Status: AC | PRN
  Administered 2024-09-17 (×2): 1 mg via INTRAVENOUS

## 2024-09-17 MED ORDER — POTASSIUM CHLORIDE 10 MEQ/100ML IV SOLN
10.0000 meq | Freq: Once | INTRAVENOUS | Status: AC
  Administered 2024-09-17: 10 meq via INTRAVENOUS

## 2024-09-17 MED ORDER — LIDOCAINE HCL (CARDIAC) PF 100 MG/5ML IV SOSY
PREFILLED_SYRINGE | INTRAVENOUS | Status: AC | PRN
  Administered 2024-09-17: 100 mg via INTRAVENOUS

## 2024-09-17 MED ORDER — CALCIUM CHLORIDE 10 % IV SOLN
INTRAVENOUS | Status: AC | PRN
  Administered 2024-09-17: 1 g via INTRAVENOUS

## 2024-09-20 NOTE — Progress Notes (Addendum)
    0955  Spiritual Encounters  Care provided to: Family;Pt not available  Conversation partners present during encounter Nurse  Referral source Code page  Reason for visit Urgent spiritual support  OnCall Visit Yes   Responded to a call to report to trauma C as family of patient at bedside while CPR is being performed. Family aware of patient's transitioning and wanted to remain present at bedside. Wife and two adult sons gathered in prayer before each saying final words to patient right before patient transitioned and was pronounced. Family are grateful hospital staff for their actions and lifesaving attempts. Family relocated in [place] in September and have no other relatives here.  Patient's mother has been notified, and his brothers are driving in to [place]. Brother-in-law should arrive shortly. Patient will be as M.E. case. Family will gather at the apartment they temporarily rented with other family members; however, some family members will arrive at the hospital and meet in the consult room. Family was advised that patient mostly will no longer in the room and arriving family may not see patient until he arrives at the funeral home. Family will discuss funeral arrangement and notify patient placement once patient's family determines the next step.  Per spouse, wife and children will be moving back to their hometown. Advised spouse of the availability of chaplain support and grief counseling for family.

## 2024-09-20 NOTE — Code Documentation (Signed)
 Pulse check; No pulse; CPR reinitiated
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
## 2024-09-20 NOTE — Code Documentation (Signed)
Patient time of death occurred at [id number].
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
## 2024-09-20 NOTE — ED Notes (Signed)
Chaplain paged  

## 2024-09-20 NOTE — Code Documentation (Signed)
Family at beside. Family given emotional support. 

## 2024-09-20 NOTE — Progress Notes (Signed)
    1037  Spiritual Encounters  Type of Visit Follow up  Care provided to: Family  Reason for visit Urgent spiritual support  OnCall Visit Yes   Patient's mother and brother arrived. Joined other family members in consult room. Provided additional support and prayer. Advised family of next steps once they determine which funeral home they will select as patient will be funeralized in [place]  area.

## 2024-09-20 DEATH — deceased
